# Patient Record
Sex: Male | Born: 1980 | Hispanic: Yes | Marital: Married | Smoking: Never smoker
Health system: Southern US, Community
[De-identification: ages and names within clinical notes are randomized; demographics above are authoritative.]

## PROBLEM LIST (undated history)

## (undated) DIAGNOSIS — E119 Type 2 diabetes mellitus without complications: Secondary | ICD-10-CM

---

## 2009-08-06 ENCOUNTER — Emergency Department (HOSPITAL_COMMUNITY): Admission: EM | Admit: 2009-08-06 | Discharge: 2009-08-06 | Payer: Self-pay | Admitting: Emergency Medicine

## 2011-02-04 ENCOUNTER — Emergency Department (HOSPITAL_COMMUNITY)
Admission: EM | Admit: 2011-02-04 | Discharge: 2011-02-04 | Disposition: A | Discharge status: Home / Self Care | Payer: Self-pay | Attending: Emergency Medicine | Admitting: Emergency Medicine

## 2011-02-04 ENCOUNTER — Emergency Department (HOSPITAL_COMMUNITY): Payer: Self-pay

## 2011-02-04 DIAGNOSIS — R61 Generalized hyperhidrosis: Secondary | ICD-10-CM | POA: Insufficient documentation

## 2011-02-04 DIAGNOSIS — R259 Unspecified abnormal involuntary movements: Secondary | ICD-10-CM | POA: Insufficient documentation

## 2011-02-04 DIAGNOSIS — M25569 Pain in unspecified knee: Secondary | ICD-10-CM | POA: Insufficient documentation

## 2011-02-04 DIAGNOSIS — N390 Urinary tract infection, site not specified: Secondary | ICD-10-CM | POA: Insufficient documentation

## 2011-02-04 DIAGNOSIS — R55 Syncope and collapse: Secondary | ICD-10-CM | POA: Insufficient documentation

## 2011-02-04 DIAGNOSIS — R42 Dizziness and giddiness: Secondary | ICD-10-CM | POA: Insufficient documentation

## 2011-02-04 LAB — CBC
Hemoglobin: 15 g/dL (ref 13.0–17.0)
MCH: 32.4 pg (ref 26.0–34.0)
MCHC: 36.4 g/dL — ABNORMAL HIGH (ref 30.0–36.0)
Platelets: 154 10*3/uL (ref 150–400)
WBC: 11.8 10*3/uL — ABNORMAL HIGH (ref 4.0–10.5)

## 2011-02-04 LAB — DIFFERENTIAL
Basophils Absolute: 0 10*3/uL (ref 0.0–0.1)
Eosinophils Absolute: 0.2 10*3/uL (ref 0.0–0.7)
Monocytes Absolute: 0.8 10*3/uL (ref 0.1–1.0)
Monocytes Relative: 7 % (ref 3–12)
Neutrophils Relative %: 74 % (ref 43–77)

## 2011-02-04 LAB — URINALYSIS, ROUTINE W REFLEX MICROSCOPIC
Bilirubin Urine: NEGATIVE
Hgb urine dipstick: NEGATIVE
Leukocytes, UA: NEGATIVE
Nitrite: NEGATIVE
Protein, ur: 30 mg/dL — AB
Urobilinogen, UA: 1 mg/dL (ref 0.0–1.0)

## 2011-02-04 LAB — POCT I-STAT TROPONIN I: Troponin i, poc: 0.01 ng/mL (ref 0.00–0.08)

## 2011-02-04 LAB — RAPID URINE DRUG SCREEN, HOSP PERFORMED
Barbiturates: NOT DETECTED
Benzodiazepines: NOT DETECTED
Cocaine: NOT DETECTED
Tetrahydrocannabinol: NOT DETECTED

## 2011-02-04 LAB — COMPREHENSIVE METABOLIC PANEL
ALT: 18 U/L (ref 0–53)
AST: 26 U/L (ref 0–37)
Albumin: 4.1 g/dL (ref 3.5–5.2)
BUN: 15 mg/dL (ref 6–23)
Chloride: 101 mEq/L (ref 96–112)
Creatinine, Ser: 0.75 mg/dL (ref 0.50–1.35)
GFR calc Af Amer: >60 mL/min (ref >60)
GFR calc non Af Amer: >60 mL/min (ref >60)
Glucose, Bld: 129 mg/dL — ABNORMAL HIGH (ref 70–99)
Potassium: 3.7 mEq/L (ref 3.5–5.1)
Sodium: 136 mEq/L (ref 135–145)
Total Bilirubin: 0.5 mg/dL (ref 0.3–1.2)

## 2011-02-08 ENCOUNTER — Inpatient Hospital Stay (INDEPENDENT_AMBULATORY_CARE_PROVIDER_SITE_OTHER)
Admission: RE | Admit: 2011-02-08 | Discharge: 2011-02-08 | Disposition: A | Discharge status: Home / Self Care | Payer: Self-pay | Source: Ambulatory Visit | Attending: Family Medicine | Admitting: Family Medicine

## 2011-02-08 DIAGNOSIS — F17200 Nicotine dependence, unspecified, uncomplicated: Secondary | ICD-10-CM

## 2011-02-10 ENCOUNTER — Emergency Department (HOSPITAL_COMMUNITY): Payer: Self-pay

## 2011-02-10 ENCOUNTER — Emergency Department (HOSPITAL_COMMUNITY)
Admission: EM | Admit: 2011-02-10 | Discharge: 2011-02-10 | Disposition: A | Discharge status: Home / Self Care | Payer: Self-pay | Attending: Emergency Medicine | Admitting: Emergency Medicine

## 2011-02-10 DIAGNOSIS — R55 Syncope and collapse: Secondary | ICD-10-CM | POA: Insufficient documentation

## 2011-02-10 DIAGNOSIS — G40909 Epilepsy, unspecified, not intractable, without status epilepticus: Secondary | ICD-10-CM | POA: Insufficient documentation

## 2011-02-10 LAB — BASIC METABOLIC PANEL
BUN: 13 mg/dL (ref 6–23)
CO2: 26 mEq/L (ref 19–32)
Chloride: 101 mEq/L (ref 96–112)
GFR calc non Af Amer: >60 mL/min (ref >60)
Glucose, Bld: 102 mg/dL — ABNORMAL HIGH (ref 70–99)
Potassium: 3.7 mEq/L (ref 3.5–5.1)
Sodium: 137 mEq/L (ref 135–145)

## 2011-02-10 LAB — CBC
HCT: 41.2 % (ref 39.0–52.0)
Hemoglobin: 15.2 g/dL (ref 13.0–17.0)
MCHC: 36.9 g/dL — ABNORMAL HIGH (ref 30.0–36.0)
RBC: 4.64 MIL/uL (ref 4.22–5.81)
WBC: 7.7 10*3/uL (ref 4.0–10.5)

## 2011-02-10 LAB — DIFFERENTIAL
Basophils Absolute: 0 10*3/uL (ref 0.0–0.1)
Basophils Relative: 1 % (ref 0–1)
Eosinophils Absolute: 0.2 10*3/uL (ref 0.0–0.7)
Neutro Abs: 4.6 10*3/uL (ref 1.7–7.7)
Neutrophils Relative %: 60 % (ref 43–77)

## 2011-02-10 LAB — POCT I-STAT TROPONIN I: Troponin i, poc: 0 ng/mL (ref 0.00–0.08)

## 2011-05-28 DIAGNOSIS — R51 Headache: Secondary | ICD-10-CM | POA: Insufficient documentation

## 2011-05-29 ENCOUNTER — Emergency Department (HOSPITAL_COMMUNITY)
Admission: EM | Admit: 2011-05-29 | Discharge: 2011-05-29 | Discharge status: Against Medical Advice | Payer: Self-pay | Attending: Emergency Medicine | Admitting: Emergency Medicine

## 2011-05-29 ENCOUNTER — Encounter: Payer: Self-pay | Admitting: *Deleted

## 2011-05-29 NOTE — ED Notes (Signed)
C/o HA, also some dizziness. (denies: nv or fever), onset ~ 2 weeks ago. Rates pain as severe.

## 2017-03-13 ENCOUNTER — Encounter (HOSPITAL_COMMUNITY): Payer: Self-pay

## 2017-03-13 ENCOUNTER — Emergency Department (HOSPITAL_COMMUNITY)
Admission: EM | Admit: 2017-03-13 | Discharge: 2017-03-14 | Disposition: A | Discharge status: Home / Self Care | Payer: No Typology Code available for payment source | Attending: Emergency Medicine | Admitting: Emergency Medicine

## 2017-03-13 ENCOUNTER — Emergency Department (HOSPITAL_COMMUNITY): Payer: No Typology Code available for payment source

## 2017-03-13 DIAGNOSIS — Y9389 Activity, other specified: Secondary | ICD-10-CM | POA: Diagnosis not present

## 2017-03-13 DIAGNOSIS — R079 Chest pain, unspecified: Secondary | ICD-10-CM | POA: Diagnosis present

## 2017-03-13 DIAGNOSIS — Y92410 Unspecified street and highway as the place of occurrence of the external cause: Secondary | ICD-10-CM | POA: Insufficient documentation

## 2017-03-13 DIAGNOSIS — R0789 Other chest pain: Secondary | ICD-10-CM | POA: Insufficient documentation

## 2017-03-13 DIAGNOSIS — Y999 Unspecified external cause status: Secondary | ICD-10-CM | POA: Diagnosis not present
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
## 2017-03-14 NOTE — ED Notes (Signed)
Patient transported to X-ray 

## 2017-03-14 NOTE — Discharge Instructions (Signed)
Chest x-ray today was normal-- no injuries. Recommend tylenol or motrin/advil/aleve to help with pain. Can follow-up with your primary care doctor if any ongoing issues. Return here for any new/worsening symptoms.

## 2018-02-21 ENCOUNTER — Emergency Department (HOSPITAL_COMMUNITY)
Admission: EM | Admit: 2018-02-21 | Discharge: 2018-02-22 | Disposition: A | Discharge status: Home / Self Care | Payer: No Typology Code available for payment source | Attending: Emergency Medicine | Admitting: Emergency Medicine

## 2018-02-21 ENCOUNTER — Encounter (HOSPITAL_COMMUNITY): Payer: Self-pay | Admitting: Emergency Medicine

## 2018-02-21 ENCOUNTER — Other Ambulatory Visit: Payer: Self-pay

## 2018-02-21 DIAGNOSIS — S161XXA Strain of muscle, fascia and tendon at neck level, initial encounter: Secondary | ICD-10-CM | POA: Diagnosis not present

## 2018-02-21 DIAGNOSIS — Y999 Unspecified external cause status: Secondary | ICD-10-CM | POA: Diagnosis not present

## 2018-02-21 DIAGNOSIS — S199XXA Unspecified injury of neck, initial encounter: Secondary | ICD-10-CM | POA: Diagnosis present

## 2018-02-21 DIAGNOSIS — Y9389 Activity, other specified: Secondary | ICD-10-CM | POA: Insufficient documentation

## 2018-02-21 DIAGNOSIS — Y92410 Unspecified street and highway as the place of occurrence of the external cause: Secondary | ICD-10-CM | POA: Insufficient documentation
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
## 2018-02-22 ENCOUNTER — Emergency Department (HOSPITAL_COMMUNITY): Payer: No Typology Code available for payment source
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
## 2019-11-25 ENCOUNTER — Emergency Department (HOSPITAL_COMMUNITY): Payer: Self-pay

## 2019-11-25 ENCOUNTER — Emergency Department (HOSPITAL_COMMUNITY)
Admission: EM | Admit: 2019-11-25 | Discharge: 2019-11-25 | Disposition: A | Discharge status: Home / Self Care | Payer: Self-pay | Attending: Emergency Medicine | Admitting: Emergency Medicine

## 2019-11-25 ENCOUNTER — Other Ambulatory Visit: Payer: Self-pay

## 2019-11-25 DIAGNOSIS — R05 Cough: Secondary | ICD-10-CM | POA: Insufficient documentation

## 2019-11-25 DIAGNOSIS — R1012 Left upper quadrant pain: Secondary | ICD-10-CM | POA: Insufficient documentation

## 2019-11-25 DIAGNOSIS — R6883 Chills (without fever): Secondary | ICD-10-CM | POA: Insufficient documentation

## 2019-11-25 LAB — CBC WITH DIFFERENTIAL/PLATELET
Abs Immature Granulocytes: 0.03 10*3/uL (ref 0.00–0.07)
Basophils Absolute: 0 10*3/uL (ref 0.0–0.1)
Basophils Relative: 0 %
Eosinophils Absolute: 0.2 10*3/uL (ref 0.0–0.5)
Eosinophils Relative: 2 %
HCT: 40.6 % (ref 39.0–52.0)
Hemoglobin: 13.7 g/dL (ref 13.0–17.0)
Immature Granulocytes: 0 %
Lymphocytes Relative: 30 %
Lymphs Abs: 3 10*3/uL (ref 0.7–4.0)
MCH: 31.5 pg (ref 26.0–34.0)
MCHC: 33.7 g/dL (ref 30.0–36.0)
MCV: 93.3 fL (ref 80.0–100.0)
Monocytes Absolute: 0.5 10*3/uL (ref 0.1–1.0)
Monocytes Relative: 5 %
Neutro Abs: 6.2 10*3/uL (ref 1.7–7.7)
Neutrophils Relative %: 63 %
Platelets: 132 10*3/uL — ABNORMAL LOW (ref 150–400)
RBC: 4.35 MIL/uL (ref 4.22–5.81)
RDW: 11.9 % (ref 11.5–15.5)
WBC: 10 10*3/uL (ref 4.0–10.5)
nRBC: 0 % (ref 0.0–0.2)

## 2019-11-25 LAB — COMPREHENSIVE METABOLIC PANEL
ALT: 19 U/L (ref 0–44)
AST: 20 U/L (ref 15–41)
Albumin: 3.6 g/dL (ref 3.5–5.0)
Alkaline Phosphatase: 62 U/L (ref 38–126)
Anion gap: 11 (ref 5–15)
BUN: 22 mg/dL — ABNORMAL HIGH (ref 6–20)
CO2: 23 mmol/L (ref 22–32)
Calcium: 8.9 mg/dL (ref 8.9–10.3)
Chloride: 106 mmol/L (ref 98–111)
Creatinine, Ser: 0.95 mg/dL (ref 0.61–1.24)
GFR calc Af Amer: >60 mL/min (ref >60)
GFR calc non Af Amer: >60 mL/min (ref >60)
Glucose, Bld: 137 mg/dL — ABNORMAL HIGH (ref 70–99)
Potassium: 3.6 mmol/L (ref 3.5–5.1)
Sodium: 140 mmol/L (ref 135–145)
Total Bilirubin: 0.5 mg/dL (ref 0.3–1.2)
Total Protein: 6.3 g/dL — ABNORMAL LOW (ref 6.5–8.1)

## 2019-11-25 LAB — URINALYSIS, ROUTINE W REFLEX MICROSCOPIC
Bilirubin Urine: NEGATIVE
Glucose, UA: NEGATIVE mg/dL
Hgb urine dipstick: NEGATIVE
Ketones, ur: NEGATIVE mg/dL
Leukocytes,Ua: NEGATIVE
Nitrite: NEGATIVE
Protein, ur: NEGATIVE mg/dL
Specific Gravity, Urine: 1.017 (ref 1.005–1.030)
pH: 6 (ref 5.0–8.0)

## 2019-11-25 LAB — LIPASE, BLOOD: Lipase: 35 U/L (ref 11–51)

## 2019-11-25 MED ORDER — KETOROLAC TROMETHAMINE 30 MG/ML IJ SOLN
60.0000 mg | Freq: Once | INTRAMUSCULAR | Status: DC
  Filled 2019-11-25: qty 2

## 2019-11-25 MED ORDER — SODIUM CHLORIDE 0.9% FLUSH: 3.0000 mL | Freq: Once | INTRAVENOUS | Status: DC

## 2019-11-25 MED ORDER — KETOROLAC TROMETHAMINE 30 MG/ML IJ SOLN
30.0000 mg | Freq: Once | INTRAMUSCULAR | Status: AC
  Administered 2019-11-25: 30 mg via INTRAVENOUS

## 2019-11-25 NOTE — ED Notes (Signed)
Discharge instructions discussed with pt. Pt verbalized understanding with no questions at this time. Pt to go home with spouse at bedside °

## 2019-11-25 NOTE — Discharge Instructions (Addendum)
You have been seen for abdominal pain.  I want you to take Prilosec which is an over-the-counter antiacid pill.  Please take as prescribed for the next 3 weeks. you can find this at your local drugstore.  You can also asked the pharmacist for help finding  a proton pump inhibitor. I want you to follow-up with your primary care doctor for further evaluation in 2 weeks.   I have also given you information about acid reflux and it gives you options for diet that will decrease flareups.  I want you to come back to the emergency department if you develop severe nausea, vomiting, diarrhea, shortness of breath, chest pain as the symptoms will require further evaluation.

## 2019-11-25 NOTE — ED Notes (Signed)
Pt reports return of pain left abdomen area radiation to back. Reports pain 8/10. States pain similar to previous pain episode. PA Chrissie Noa at bedside

## 2019-11-25 NOTE — ED Triage Notes (Signed)
Pt from home by EMS for stabbing upper quadrant pain radiating into back pta. Pt spanish speaking only. 20gIV to [facility]

## 2019-11-25 NOTE — ED Provider Notes (Signed)
[facility] EMERGENCY DEPARTMENT Provider Note   CSN: [id number] Arrival date & time: 11/25/19  0156     History Chief Complaint  Patient presents with  . Abdominal Pain    Colton Shields is a 39 y.o. male.  HPI   Interpreter was used to obtain HPI.  Patient presents emerge department with chief complaint of upper left abdominal pain that started at 1:30 PM.  Patient states the pain came on suddenly while he was showering and it radiates to the left side of his back.  Pain lasted about 1 hour and then went away.  He is currently not in any pain right now.  He denies nausea, vomiting, diarrhea or urinary symptoms.  Patient admitts that over the last few days he has felt hot and has had a slight cough.  He denies recent sick contacts, and is not currently vaccinated for COVID-19.  Patient has significant medical history of diabetes, he denies having pancreatitis, kidney stones or recent surgeries.  Patient does not drink, he smokes 6 cigarettes a day and denies use of drugs.  No past medical history on file.  There are no problems to display for this patient.   No past surgical history on file.     No family history on file.  Social History   Tobacco Use  . Smoking status: Never Smoker  . Smokeless tobacco: Never Used  Substance Use Topics  . Alcohol use: No  . Drug use: No    Home Medications Prior to Admission medications   Medication Sig Start Date End Date Taking? Authorizing Provider  sitaGLIPtin-metformin (JANUMET) 50-1000 MG tablet Take 1 tablet by mouth 2 (two) times daily with a meal.   Yes [provider]    Allergies    Patient has no known allergies.  Review of Systems   Review of Systems  Constitutional: Positive for chills. Negative for diaphoresis, fatigue and fever.  HENT: Negative for congestion and sore throat.   Eyes: Negative for visual disturbance.  Respiratory: Positive for cough. Negative for shortness of  breath.   Cardiovascular: Negative for chest pain and leg swelling.  Gastrointestinal: Positive for abdominal pain. Negative for diarrhea, nausea and vomiting.       Admits to upper left abdominal pain that radiates to the left side of his back.  Genitourinary: Negative for dysuria, enuresis and flank pain.  Musculoskeletal: Negative for back pain.  Skin: Negative for rash.  Neurological: Negative for dizziness and headaches.  Hematological: Does not bruise/bleed easily.    Physical Exam Updated Vital Signs BP 116/79   Pulse 66   Temp 98.2 F (36.8 C) (Oral)   Resp 18   SpO2 99%   Physical Exam Vitals and nursing note reviewed.  Constitutional:      General: He is not in acute distress.    Appearance: He is not ill-appearing.  HENT:     Head: Normocephalic and atraumatic.     Nose: No congestion.     Mouth/Throat:     Mouth: Mucous membranes are moist.     Pharynx: Oropharynx is clear. No oropharyngeal exudate or posterior oropharyngeal erythema.  Eyes:     General: No scleral icterus. Cardiovascular:     Rate and Rhythm: Normal rate and regular rhythm.     Pulses: Normal pulses.     Heart sounds: No murmur. No friction rub. No gallop.   Pulmonary:     Effort: No respiratory distress.     Breath sounds:  No wheezing, rhonchi or rales.  Abdominal:     General: There is no distension.     Palpations: Abdomen is soft.     Tenderness: There is no abdominal tenderness. There is no guarding.  Musculoskeletal:        General: No swelling.     Cervical back: No rigidity or tenderness.  Skin:    General: Skin is warm and dry.     Capillary Refill: Capillary refill takes less than 2 seconds.     Findings: No rash.  Neurological:     Mental Status: He is alert and oriented to person, place, and time.  Psychiatric:        Mood and Affect: Mood normal.     ED Results / Procedures / Treatments   Labs (all labs ordered are listed, but only abnormal results are  displayed) Labs Reviewed  COMPREHENSIVE METABOLIC PANEL - Abnormal; Notable for the following components:      Result Value   Glucose, Bld 137 (*)    BUN 22 (*)    Total Protein 6.3 (*)    All other components within normal limits  CBC WITH DIFFERENTIAL/PLATELET - Abnormal; Notable for the following components:   Platelets 132 (*)    All other components within normal limits  LIPASE, BLOOD  URINALYSIS, ROUTINE W REFLEX MICROSCOPIC    EKG EKG Interpretation  Date/Time:  Sunday November 25 2019 02:02:25 EDT Ventricular Rate:  56 PR Interval:    QRS Duration: 80 QT Interval:  413 QTC Calculation: 399 R Axis:   89 Text Interpretation: Sinus rhythm Nonspecific T abnrm, anterolateral leads ST elev, probable normal early repol pattern When compared with ECG of 02/04/2011, No significant change was found Confirmed by Delora Fuel ([id number]) on 11/25/2019 2:12:01 AM   Radiology DG Chest 2 View  Result Date: 11/25/2019 CLINICAL DATA:  Cough.  Right upper quadrant pain. EXAM: CHEST - 2 VIEW COMPARISON:  Chest radiograph 02/22/2018 FINDINGS: The cardiomediastinal contours are normal. The lungs are clear. Pulmonary vasculature is normal. No consolidation, pleural effusion, or pneumothorax. No acute osseous abnormalities are seen. IMPRESSION: Negative radiographs of the chest. Electronically Signed   By: Keith Rake M.D.   On: 11/25/2019 03:33    Procedures Procedures (including critical care time)  Medications Ordered in ED Medications  sodium chloride flush (NS) 0.9 % injection 3 mL (3 mLs Intravenous Not Given 11/25/19 0204)  ketorolac (TORADOL) 30 MG/ML injection 30 mg (30 mg Intravenous Given 11/25/19 0320)    ED Course  I have reviewed the triage vital signs and the nursing notes.  Pertinent labs & imaging results that were available during my care of the patient were reviewed by me and considered in my medical decision making (see chart for details).    MDM Rules/Calculators/A&P                       I have personally reviewed all imaging, labs and have interpreted them.  Due to HPI I am concerned for lower lobe pneumonia versus pancreatitis versus kidney stone.  Patient's pain just started as he was going to x-ray.  We will give him Toradol for pain and continue to monitor him.  I have low suspicion for pancreatitis as the patient's lipase was 35, physical exam was benign and  he described the pain as more upper left and flank pain which would be a atypical presentation.  I have low suspicion for pneumonia as his chest x-ray did  not show consolidation, edema, widened mediastinum and he had clear lungs bilaterally.  I have low suspicion for kidney stones as his UA did not show any abnormalities no hemoglobin was seen and he denies any urinary symptoms.  Patient appears to be resting comfortably in bed, not having any difficulty breathing, vitals are stable.  Patient does not meet criteria for emergent admission to the hospital.  It is likely that the patient might be suffering from gastritis.  Patient will be placed on PPI and instructed to follow-up with his primary care doctor for further evaluation.  Given at home instructions as well as strict return precautions.  Attending saw patient and agrees with assessment and plan for patient.  Patient was explained the results and plan, he verbalized that he understood and agreed with the plan. Final Clinical Impression(s) / ED Diagnoses Final diagnoses:  Left upper quadrant abdominal pain    Rx / DC Orders ED Discharge Orders    None       Carroll Sage, PA-C 11/25/19 0519    Dione Booze, MD 11/25/19 [phone]

## 2019-11-25 NOTE — ED Notes (Signed)
Pt transported to XR.  

## 2020-11-13 ENCOUNTER — Encounter (HOSPITAL_COMMUNITY): Payer: Self-pay | Admitting: Emergency Medicine

## 2020-11-13 ENCOUNTER — Other Ambulatory Visit: Payer: Self-pay

## 2020-11-13 ENCOUNTER — Emergency Department (HOSPITAL_COMMUNITY)
Admission: EM | Admit: 2020-11-13 | Discharge: 2020-11-13 | Disposition: A | Discharge status: Home / Self Care | Payer: Self-pay | Attending: Emergency Medicine | Admitting: Emergency Medicine

## 2020-11-13 DIAGNOSIS — W312XXA Contact with powered woodworking and forming machines, initial encounter: Secondary | ICD-10-CM | POA: Insufficient documentation

## 2020-11-13 DIAGNOSIS — S81811A Laceration without foreign body, right lower leg, initial encounter: Secondary | ICD-10-CM

## 2020-11-13 DIAGNOSIS — Z23 Encounter for immunization: Secondary | ICD-10-CM | POA: Insufficient documentation

## 2020-11-13 DIAGNOSIS — S71111A Laceration without foreign body, right thigh, initial encounter: Secondary | ICD-10-CM | POA: Insufficient documentation

## 2020-11-13 DIAGNOSIS — E119 Type 2 diabetes mellitus without complications: Secondary | ICD-10-CM | POA: Insufficient documentation

## 2020-11-13 DIAGNOSIS — Z7984 Long term (current) use of oral hypoglycemic drugs: Secondary | ICD-10-CM | POA: Insufficient documentation

## 2020-11-13 HISTORY — DX: Type 2 diabetes mellitus without complications: E11.9

## 2020-11-13 MED ORDER — CEPHALEXIN 500 MG PO CAPS: 500.0000 mg | ORAL_CAPSULE | Freq: Four times a day (QID) | ORAL | 0 refills | Status: AC

## 2020-11-13 MED ORDER — TETANUS-DIPHTH-ACELL PERTUSSIS 5-2.5-18.5 LF-MCG/0.5 IM SUSY
0.5000 mL | PREFILLED_SYRINGE | Freq: Once | INTRAMUSCULAR | Status: AC
  Administered 2020-11-13: 0.5 mL via INTRAMUSCULAR
  Filled 2020-11-13: qty 0.5

## 2020-11-13 MED ORDER — LIDOCAINE-EPINEPHRINE-TETRACAINE (LET) TOPICAL GEL
3.0000 mL | Freq: Once | TOPICAL | Status: AC
  Administered 2020-11-13: 3 mL via TOPICAL
  Filled 2020-11-13: qty 3

## 2020-11-13 MED ORDER — CEPHALEXIN 250 MG PO CAPS
500.0000 mg | ORAL_CAPSULE | Freq: Once | ORAL | Status: AC
  Administered 2020-11-13: 500 mg via ORAL
  Filled 2020-11-13: qty 2
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
## 2020-11-13 NOTE — ED Notes (Signed)
Wound dressed. ?

## 2020-11-13 NOTE — ED Provider Notes (Signed)
[facility] EMERGENCY DEPARTMENT Provider Note   CSN: [id number] Arrival date & time: 11/13/20  0101     History Chief Complaint  Patient presents with  . Laceration    Colton Shields is a 40 y.o. male.  The history is provided by the patient.  Laceration Location:  Leg Leg laceration location:  R upper leg Length:  4 Depth:  Through dermis Quality: straight   Bleeding: controlled   Time since incident:  12 hours Injury mechanism: circular saw. Pain details:    Quality:  Aching   Severity:  Mild   Timing:  Constant   Progression:  Unchanged Foreign body present:  No foreign bodies Relieved by:  Nothing Worsened by:  Nothing Ineffective treatments:  None tried Tetanus status:  Out of date Associated symptoms: no fever, no focal weakness, no numbness, no rash, no redness, no swelling and no streaking        Past Medical History:  Diagnosis Date  . Diabetes mellitus without complication (HCC)     There are no problems to display for this patient.   No past surgical history on file.     No family history on file.  Social History   Tobacco Use  . Smoking status: Never Smoker  . Smokeless tobacco: Never Used  Substance Use Topics  . Alcohol use: No  . Drug use: No    Home Medications Prior to Admission medications   Medication Sig Start Date End Date Taking? Authorizing Provider  cephALEXin (KEFLEX) 500 MG capsule Take 1 capsule (500 mg total) by mouth 4 (four) times daily. 11/13/20  Yes Heyden Jaber, MD  sitaGLIPtin-metformin (JANUMET) 50-1000 MG tablet Take 1 tablet by mouth 2 (two) times daily with a meal.    [provider]    Allergies    Patient has no known allergies.  Review of Systems   Review of Systems  Constitutional: Negative for fever.  HENT: Negative for facial swelling.   Eyes: Negative for redness.  Respiratory: Negative for wheezing.   Cardiovascular: Negative for leg swelling.   Gastrointestinal: Negative for vomiting.  Genitourinary: Negative for flank pain.  Musculoskeletal: Negative for neck stiffness.  Skin: Positive for wound. Negative for rash.  Neurological: Negative for focal weakness and facial asymmetry.  Psychiatric/Behavioral: Negative for agitation.  All other systems reviewed and are negative.   Physical Exam Updated Vital Signs BP 129/82   Pulse (!) 57   Temp 98.4 F (36.9 C) (Oral)   Resp 16   Ht 5\' 8"  (1.727 m)   Wt 83 kg   SpO2 100%   BMI 27.83 kg/m   Physical Exam Vitals and nursing note reviewed.  Constitutional:      General: He is not in acute distress.    Appearance: Normal appearance.  HENT:     Head: Normocephalic and atraumatic.     Nose: Nose normal.  Eyes:     Conjunctiva/sclera: Conjunctivae normal.     Pupils: Pupils are equal, round, and reactive to light.  Cardiovascular:     Rate and Rhythm: Normal rate and regular rhythm.     Pulses: Normal pulses.     Heart sounds: Normal heart sounds.  Pulmonary:     Effort: Pulmonary effort is normal.     Breath sounds: Normal breath sounds.  Abdominal:     General: Abdomen is flat. Bowel sounds are normal.     Palpations: Abdomen is soft.     Tenderness: There is no abdominal  tenderness. There is no guarding.  Musculoskeletal:        General: Normal range of motion.     Cervical back: Normal range of motion and neck supple.       Legs:  Skin:    General: Skin is warm and dry.     Capillary Refill: Capillary refill takes less than 2 seconds.  Neurological:     General: No focal deficit present.     Mental Status: He is alert and oriented to person, place, and time.  Psychiatric:        Mood and Affect: Mood normal.        Behavior: Behavior normal.     ED Results / Procedures / Treatments   Labs (all labs ordered are listed, but only abnormal results are displayed) Labs Reviewed - No data to display  EKG None  Radiology No results  found.  Procedures .Marland KitchenLaceration Repair  Date/Time: 11/13/2020 3:14 AM Performed by: Cy Blamer, MD Authorized by: Cy Blamer, MD   Consent:    Consent obtained:  Verbal   Consent given by:  Patient   Risks, benefits, and alternatives were discussed: yes     Risks discussed:  Infection, need for additional repair, nerve damage, pain, poor cosmetic result, poor wound healing, retained foreign body, tendon damage and vascular damage   Alternatives discussed:  No treatment Universal protocol:    Patient identity confirmed:  Arm band Anesthesia:    Anesthesia method:  Topical application   Topical anesthetic:  LET Laceration details:    Location:  Leg   Leg location:  R upper leg   Length (cm):  4   Depth (mm):  1 Pre-procedure details:    Preparation:  Patient was prepped and draped in usual sterile fashion Exploration:    Limited defect created (wound extended): no     Hemostasis achieved with:  Direct pressure Treatment:    Area cleansed with:  Chlorhexidine   Amount of cleaning:  Extensive   Irrigation solution:  Sterile saline   Irrigation method:  Syringe   Debridement:  None   Undermining:  None   Scar revision: no   Skin repair:    Repair method:  Staples Approximation:    Approximation:  Close Repair type:    Repair type:  Simple Post-procedure details:    Dressing:  Sterile dressing   Procedure completion:  Tolerated well, no immediate complications     Medications Ordered in ED Medications  Tdap (BOOSTRIX) injection 0.5 mL (0.5 mLs Intramuscular Given 11/13/20 0223)  cephALEXin (KEFLEX) capsule 500 mg (500 mg Oral Given 11/13/20 0223)  lidocaine-EPINEPHrine-tetracaine (LET) topical gel (3 mLs Topical Given 11/13/20 9735)    ED Course  I have reviewed the triage vital signs and the nursing notes.  Pertinent labs & imaging results that were available during my care of the patient were reviewed by me and considered in my medical decision making (see  chart for details).    Colton Shields was evaluated in Emergency Department on 11/13/2020 for the symptoms described in the history of present illness. He was evaluated in the context of the global COVID-19 pandemic, which necessitated consideration that the patient might be at risk for infection with the SARS-CoV-2 virus that causes COVID-19. Institutional protocols and algorithms that pertain to the evaluation of patients at risk for COVID-19 are in a state of rapid change based on information released by regulatory bodies including the CDC and federal and state organizations. These policies and algorithms  were followed during the patient's care in the ED.  Final Clinical Impression(s) / ED Diagnoses Final diagnoses:  None   Return for intractable cough, coughing up blood, fevers >100.4 unrelieved by medication, shortness of breath, intractable vomiting, chest pain, shortness of breath, weakness, numbness, changes in speech, facial asymmetry, abdominal pain, passing out, Inability to tolerate liquids or food, cough, altered mental status or any concerns. No signs of systemic illness or infection. The patient is nontoxic-appearing on exam and vital signs are within normal limits.  I have reviewed the triage vital signs and the nursing notes. Pertinent labs & imaging results that were available during my care of the patient were reviewed by me and considered in my medical decision making (see chart for details). After history, exam, and medical workup I feel the patient has been appropriately medically screened and is safe for discharge home. Pertinent diagnoses were discussed with the patient. Patient was given return precautions.   Rx / DC Orders ED Discharge Orders         Ordered    cephALEXin (KEFLEX) 500 MG capsule  4 times daily        11/13/20 0147           Abdelrahman Nair, MD 11/13/20 6004

## 2020-11-24 DIAGNOSIS — X58XXXD Exposure to other specified factors, subsequent encounter: Secondary | ICD-10-CM | POA: Insufficient documentation

## 2020-11-24 DIAGNOSIS — Z4802 Encounter for removal of sutures: Secondary | ICD-10-CM | POA: Insufficient documentation

## 2020-11-24 DIAGNOSIS — Z7984 Long term (current) use of oral hypoglycemic drugs: Secondary | ICD-10-CM | POA: Insufficient documentation

## 2020-11-24 DIAGNOSIS — S71111D Laceration without foreign body, right thigh, subsequent encounter: Secondary | ICD-10-CM | POA: Insufficient documentation

## 2020-11-24 DIAGNOSIS — E119 Type 2 diabetes mellitus without complications: Secondary | ICD-10-CM | POA: Insufficient documentation

## 2020-11-25 ENCOUNTER — Emergency Department (HOSPITAL_COMMUNITY)
Admission: EM | Admit: 2020-11-25 | Discharge: 2020-11-25 | Disposition: A | Discharge status: Home / Self Care | Payer: Self-pay | Attending: Emergency Medicine | Admitting: Emergency Medicine

## 2020-11-25 ENCOUNTER — Encounter (HOSPITAL_COMMUNITY): Payer: Self-pay | Admitting: Emergency Medicine

## 2020-11-25 ENCOUNTER — Other Ambulatory Visit: Payer: Self-pay

## 2020-11-25 DIAGNOSIS — Z4802 Encounter for removal of sutures: Secondary | ICD-10-CM

## 2020-11-25 NOTE — ED Provider Notes (Deleted)
Emergency Medicine Provider Triage Evaluation Note  Colton Shields , a 40 y.o. male  was evaluated in triage.  Pt complains of suture removal.  The patient reports that he has here to have sutures removed in his right thigh.  Sutures were initially placed on 526.  He has no other complaints at this time.  Review of Systems  Positive: Wound Negative: Numbness, weakness, arthralgias, myalgias  Physical Exam  BP (!) 145/88 (BP Location: Left Arm)   Pulse 61   Resp 18   Ht 5\' 5"  (1.651 m)   Wt 84 kg   SpO2 100%   BMI 30.82 kg/m  Gen:   Awake, no distress   Resp:  Normal effort  MSK:   Moves extremities without difficulty  Other:  Sutures in place to the anterior right thigh.  Medical Decision Making  Medically screening exam initiated at 12:59 AM.  Appropriate orders placed.  Colton Shields was informed that the remainder of the evaluation will be completed by another provider, this initial triage assessment does not replace that evaluation, and the importance of remaining in the ED until their evaluation is complete.  Patient's work-up has been initiated in the emergency department.   Colton Shields A, PA-C 11/25/20 0100

## 2020-11-25 NOTE — Discharge Instructions (Signed)
Return to the emergency department if you develop new or worsening symptoms. °

## 2020-11-25 NOTE — ED Provider Notes (Signed)
[facility] EMERGENCY DEPARTMENT Provider Note   CSN: [id number] Arrival date & time: 11/24/20  2258     History Chief Complaint  Patient presents with  . Staples Removal     Colton Shields is a 40 y.o. male with history of diabetes mellitus who presents the emergency department with a chief complaint of staple removal.  The patient had staples placed to a laceration of his right anterior thigh on May 26.  He denies associated symptoms including pain, fever, chills, redness, or warmth.   The history is provided by medical records. No language interpreter was used.       Past Medical History:  Diagnosis Date  . Diabetes mellitus without complication (HCC)     There are no problems to display for this patient.   History reviewed. No pertinent surgical history.     No family history on file.  Social History   Tobacco Use  . Smoking status: Never Smoker  . Smokeless tobacco: Never Used  Substance Use Topics  . Alcohol use: No  . Drug use: No    Home Medications Prior to Admission medications   Medication Sig Start Date End Date Taking? Authorizing Provider  cephALEXin (KEFLEX) 500 MG capsule Take 1 capsule (500 mg total) by mouth 4 (four) times daily. 11/13/20   Palumbo, April, MD  sitaGLIPtin-metformin (JANUMET) 50-1000 MG tablet Take 1 tablet by mouth 2 (two) times daily with a meal.    [provider]    Allergies    Patient has no known allergies.  Review of Systems   Review of Systems  Constitutional: Negative for activity change, chills and fever.  Respiratory: Negative for shortness of breath.   Cardiovascular: Negative for chest pain.  Gastrointestinal: Negative for abdominal pain.  Musculoskeletal: Negative for back pain.  Skin: Positive for wound. Negative for rash.  Neurological: Negative for weakness and numbness.    Physical Exam Updated Vital Signs BP (!) 145/88 (BP Location: Left Arm)   Pulse 61   Resp 18    Ht 5\' 5"  (1.651 m)   Wt 84 kg   SpO2 100%   BMI 30.82 kg/m   Physical Exam Vitals and nursing note reviewed.  Constitutional:      Appearance: He is well-developed.  HENT:     Head: Normocephalic.  Eyes:     Conjunctiva/sclera: Conjunctivae normal.  Cardiovascular:     Rate and Rhythm: Normal rate and regular rhythm.     Heart sounds: No murmur heard.   Pulmonary:     Effort: Pulmonary effort is normal.  Abdominal:     General: There is no distension.     Palpations: Abdomen is soft.  Musculoskeletal:     Cervical back: Neck supple.     Comments: Staples in place to a laceration noted to the anterior aspect of the right thigh.  No wound dehiscence.  No surrounding erythema, edema, or warmth.  Skin:    General: Skin is warm and dry.  Neurological:     Mental Status: He is alert.  Psychiatric:        Behavior: Behavior normal.     ED Results / Procedures / Treatments   Labs (all labs ordered are listed, but only abnormal results are displayed) Labs Reviewed - No data to display  EKG None  Radiology No results found.  Procedures Procedures   Medications Ordered in ED Medications - No data to display  ED Course  I have reviewed the triage  vital signs and the nursing notes.  Pertinent labs & imaging results that were available during my care of the patient were reviewed by me and considered in my medical decision making (see chart for details).    MDM Rules/Calculators/A&P                           40 year old male presenting for staple removal.  Staples removed by RN prior to my assessment.  No wound dehiscence or evidence of infection.  Pt to ER for staple/suture removal and wound check as above. Procedure tolerated well. Vitals stable. scar minimization & return precautions given at dc.  ER return precautions given.   Final Clinical Impression(s) / ED Diagnoses Final diagnoses:  Encounter for staple removal    Rx / DC Orders ED Discharge Orders     None       Barkley Boards, PA-C 11/25/20 0125    Shon Baton, MD 11/25/20 2322

## 2020-11-25 NOTE — ED Triage Notes (Signed)
Patient requesting staples removal at right upper thigh .

## 2020-11-25 NOTE — ED Notes (Signed)
Staples removed 

## 2021-02-15 IMAGING — CR DG CHEST 2V
2 series · 2 of 2 positions shown · non-contrast
Comparison: Chest radiograph 02/22/2018

CLINICAL DATA: Cough.  Right upper quadrant pain.

EXAM:
CHEST - 2 VIEW

[chest pa]
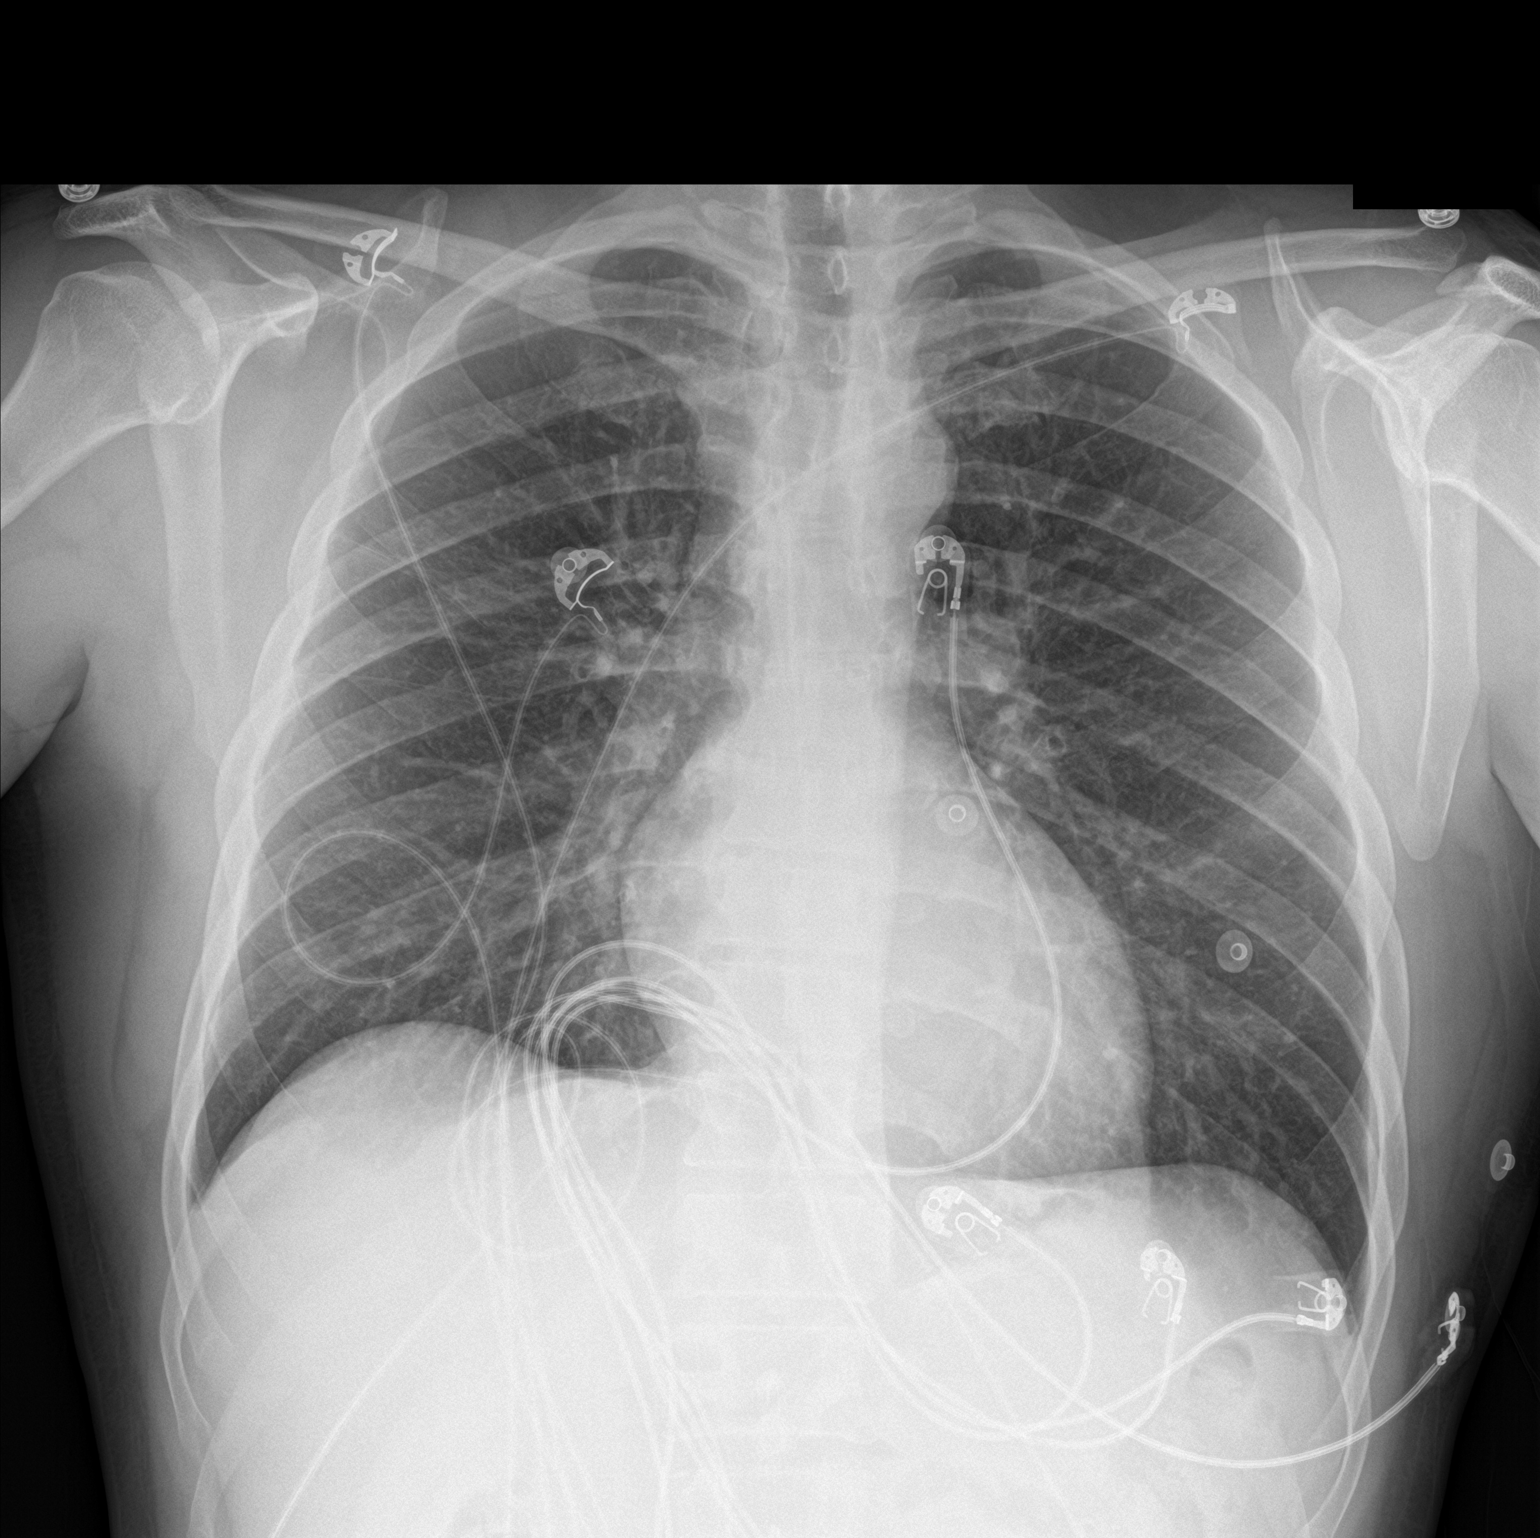

[chest lat]
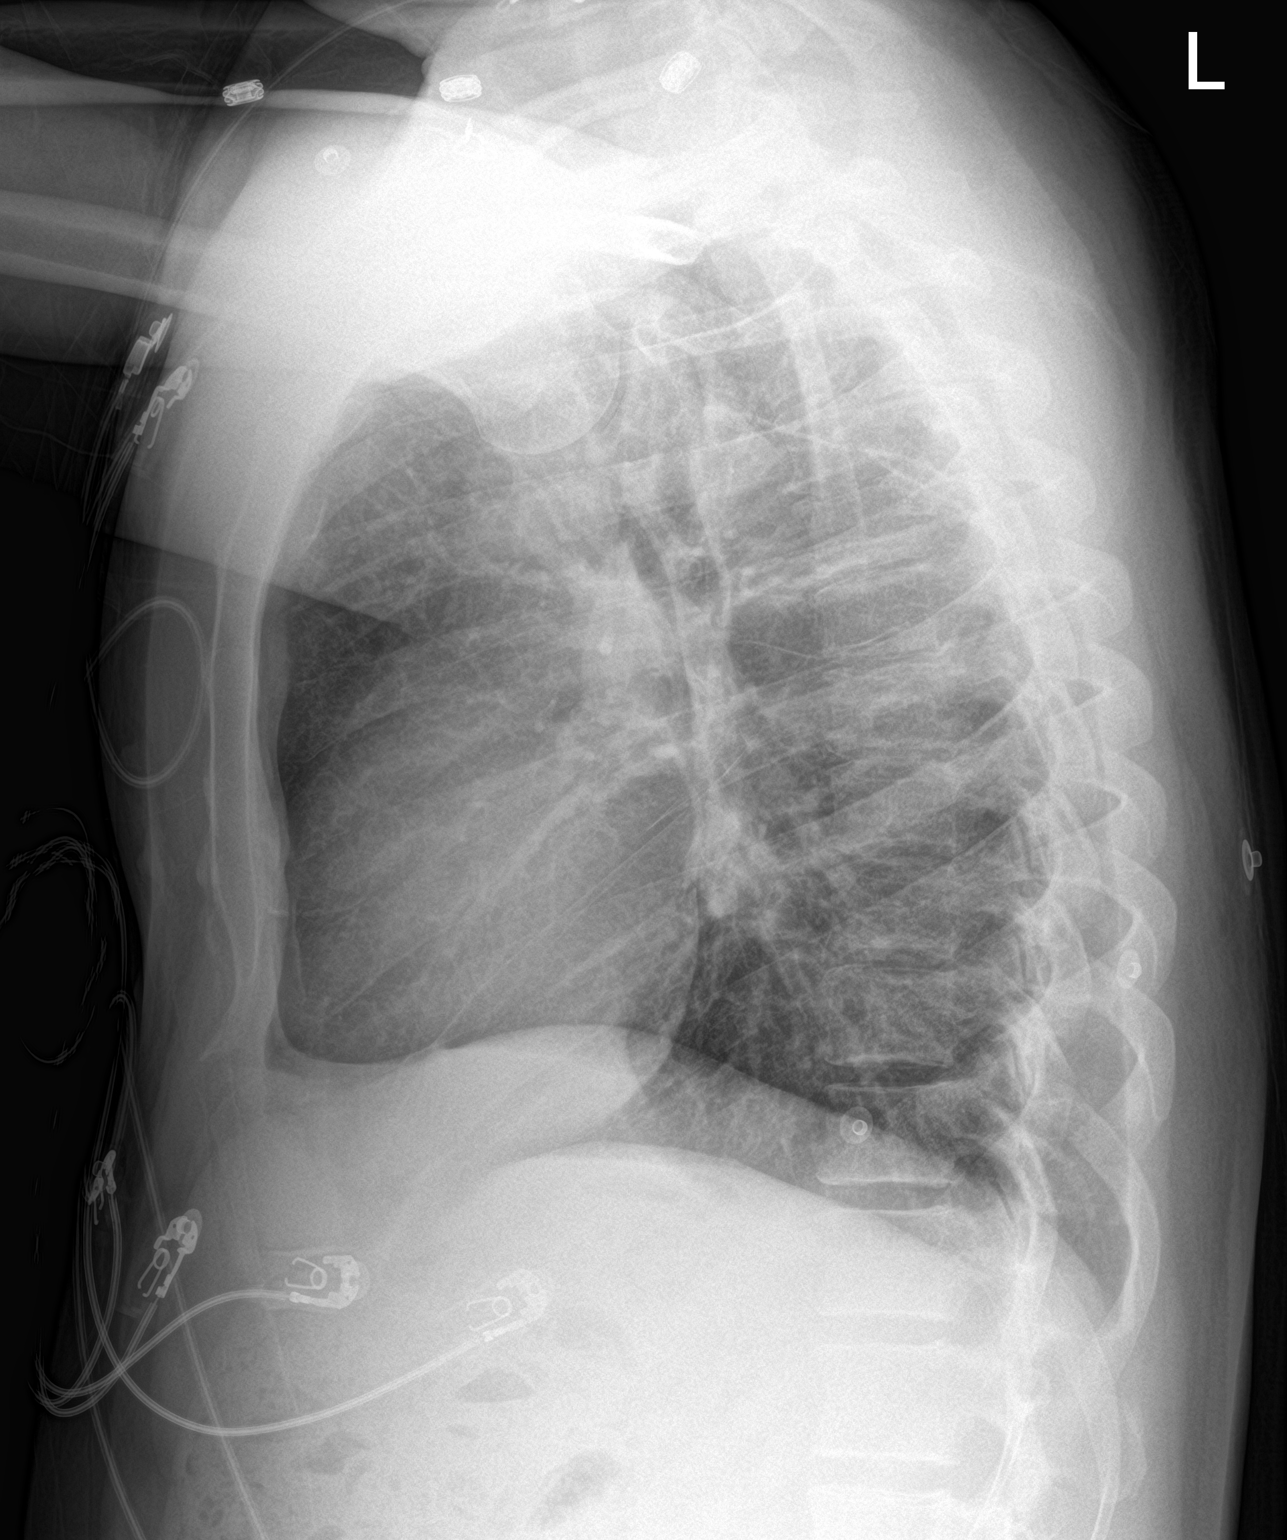

[2 of 2 positions shown; findings below may reference images not displayed]

FINDINGS: The cardiomediastinal contours are normal. The lungs are clear.
Pulmonary vasculature is normal. No consolidation, pleural effusion,
or pneumothorax. No acute osseous abnormalities are seen.
IMPRESSION: Negative radiographs of the chest.
